# Patient Record
Sex: Male | Born: 1965 | Race: White | Hispanic: No | Marital: Married
Health system: Southern US, Community
[De-identification: ages and names within clinical notes are randomized; demographics above are authoritative.]

## PROBLEM LIST (undated history)

## (undated) DIAGNOSIS — I10 Essential (primary) hypertension: Secondary | ICD-10-CM

## (undated) DIAGNOSIS — F4310 Post-traumatic stress disorder, unspecified: Secondary | ICD-10-CM

## (undated) HISTORY — PX: CHOLECYSTECTOMY: SHX55

---

## 2007-08-30 ENCOUNTER — Encounter: Payer: Self-pay | Admitting: Family Medicine

## 2007-11-01 ENCOUNTER — Encounter: Payer: Self-pay | Admitting: Family Medicine

## 2008-07-07 ENCOUNTER — Ambulatory Visit: Payer: Self-pay | Admitting: Family Medicine

## 2008-07-07 DIAGNOSIS — I10 Essential (primary) hypertension: Secondary | ICD-10-CM | POA: Insufficient documentation

## 2008-07-07 DIAGNOSIS — R740 Nonspecific elevation of levels of transaminase and lactic acid dehydrogenase [LDH]: Secondary | ICD-10-CM

## 2008-07-07 DIAGNOSIS — K219 Gastro-esophageal reflux disease without esophagitis: Secondary | ICD-10-CM | POA: Insufficient documentation

## 2009-01-22 ENCOUNTER — Ambulatory Visit: Payer: Self-pay | Admitting: Family Medicine

## 2009-01-25 LAB — CONVERTED CEMR LAB
ALT: 54 units/L — ABNORMAL HIGH (ref 0–53)
BUN: 12 mg/dL (ref 6–23)
Basophils Relative: 0.2 % (ref 0.0–3.0)
Bilirubin, Direct: 0.1 mg/dL (ref 0.0–0.3)
CO2: 29 meq/L (ref 19–32)
Calcium: 9.1 mg/dL (ref 8.4–10.5)
Chloride: 105 meq/L (ref 96–112)
Cholesterol: 149 mg/dL (ref 0–200)
Creatinine, Ser: 1 mg/dL (ref 0.4–1.5)
Eosinophils Relative: 2.1 % (ref 0.0–5.0)
HDL: 33.8 mg/dL — ABNORMAL LOW (ref >39.00)
Hemoglobin: 15.6 g/dL (ref 13.0–17.0)
LDL Cholesterol: 91 mg/dL (ref 0–99)
Lymphocytes Relative: 31 % (ref 12.0–46.0)
MCHC: 34.6 g/dL (ref 30.0–36.0)
Monocytes Relative: 4.7 % (ref 3.0–12.0)
Neutro Abs: 5.2 10*3/uL (ref 1.4–7.7)
Neutrophils Relative %: 62 % (ref 43.0–77.0)
RBC: 5.2 M/uL (ref 4.22–5.81)
Total Bilirubin: 0.9 mg/dL (ref 0.3–1.2)
Total CHOL/HDL Ratio: 4
Total Protein: 6.9 g/dL (ref 6.0–8.3)
Triglycerides: 122 mg/dL (ref 0.0–149.0)
WBC: 8.4 10*3/uL (ref 4.5–10.5)

## 2010-06-20 ENCOUNTER — Telehealth: Payer: Self-pay | Admitting: Family Medicine

## 2010-10-18 NOTE — Progress Notes (Signed)
  Phone Note Refill Request   Refills Requested: Medication #1:  FELODIPINE 10 MG XR24H-TAB take one tablet daily.   Supply Requested: 1 month  Medication #2:  ATENOLOL 25 MG TABS take one tablet daily   Supply Requested: 1 month [email or web address]   Method Requested: Electronic Initial call taken by: Benny Lennert CMA Duncan Dull),  June 20, 2010 7:43 AM  Follow-up for Phone Call        denied, no OV in 2 years. Follow-up by: Hannah Beat MD,  June 20, 2010 8:10 AM  Additional Follow-up for Phone Call Additional follow up Details #1::        Patient advised.Consuello Masse CMA   Additional Follow-up by: Benny Lennert CMA (AAMA),  June 20, 2010 8:16 AM

## 2010-11-22 ENCOUNTER — Ambulatory Visit: Payer: Self-pay | Admitting: Family Medicine

## 2011-08-24 ENCOUNTER — Ambulatory Visit: Payer: Self-pay | Admitting: Family Medicine

## 2012-02-21 IMAGING — CR DG TIBIA/FIBULA 2V*L*
1 series · 2 of 2 positions shown · non-contrast
Comparison: none

REASON FOR EXAM: closed fracture of unspecified part of fibula with tibia
COMMENTS:

[Series 1: ap · 0.17mm/px · 2 of 2 slices shown]
[im 1/2]
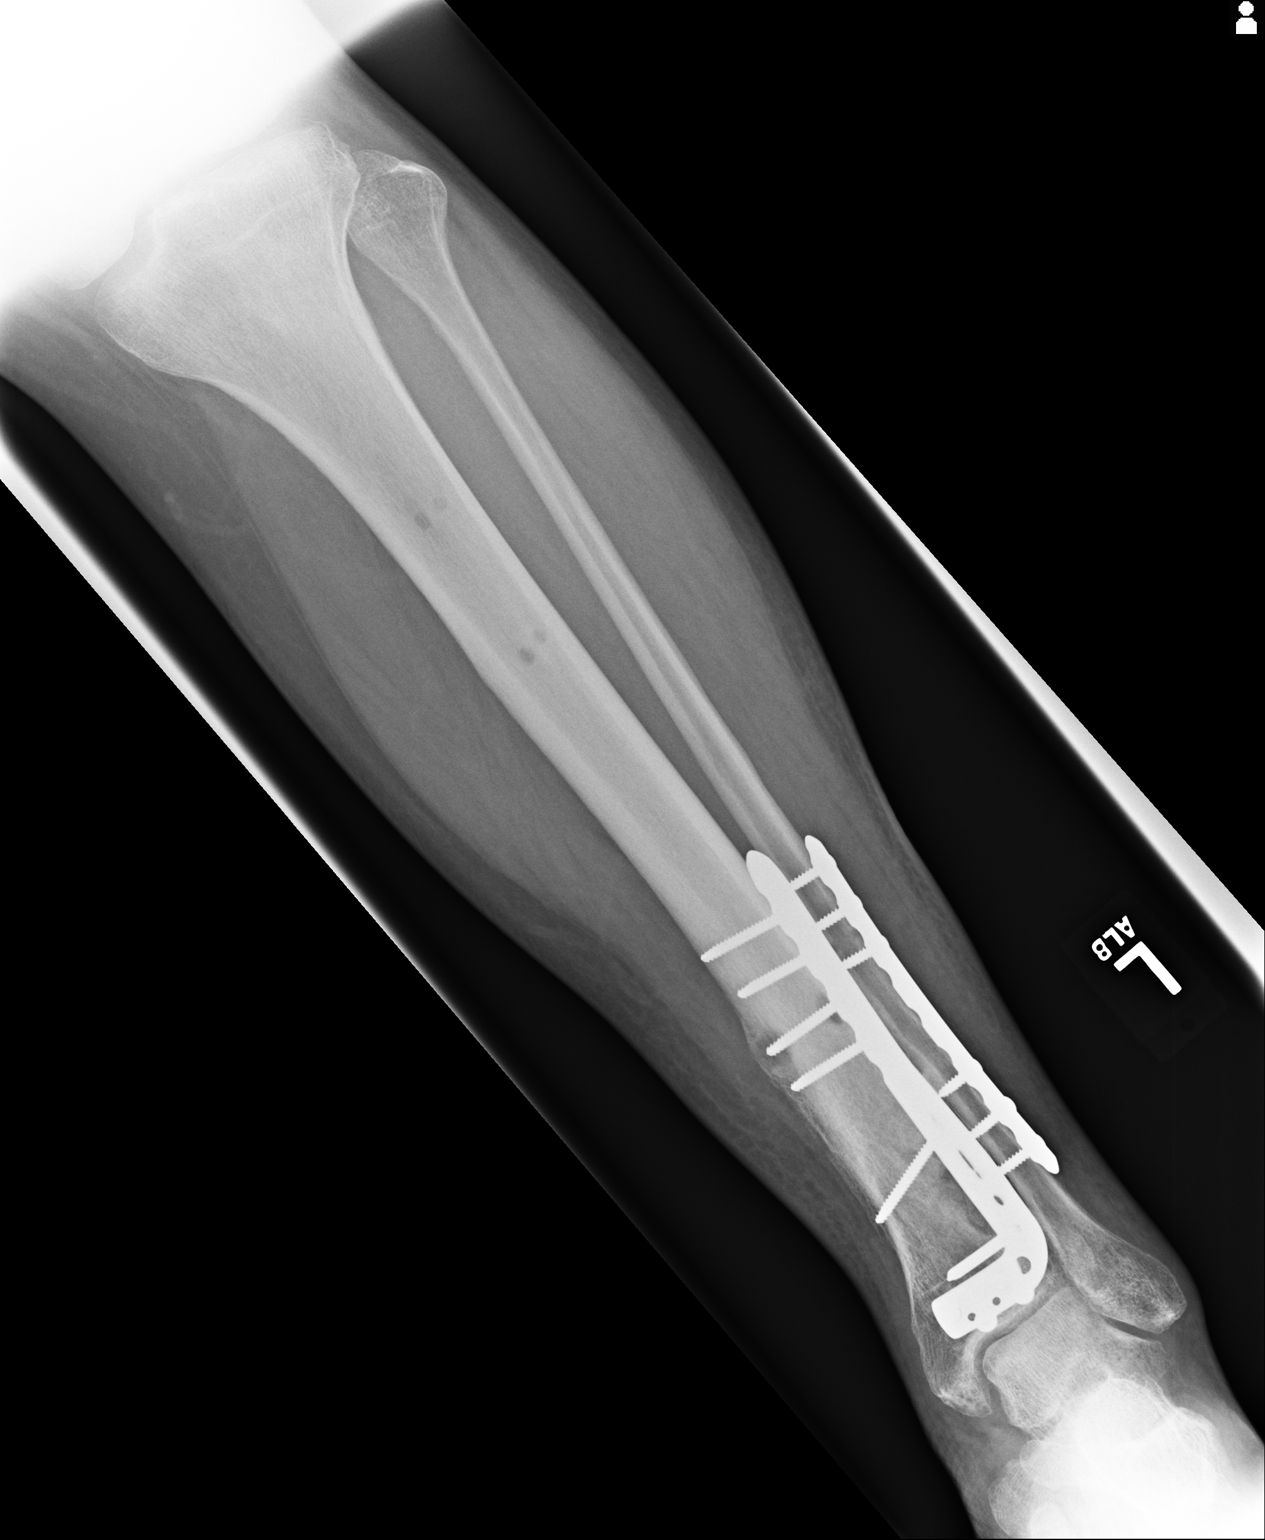
[im 2/2]
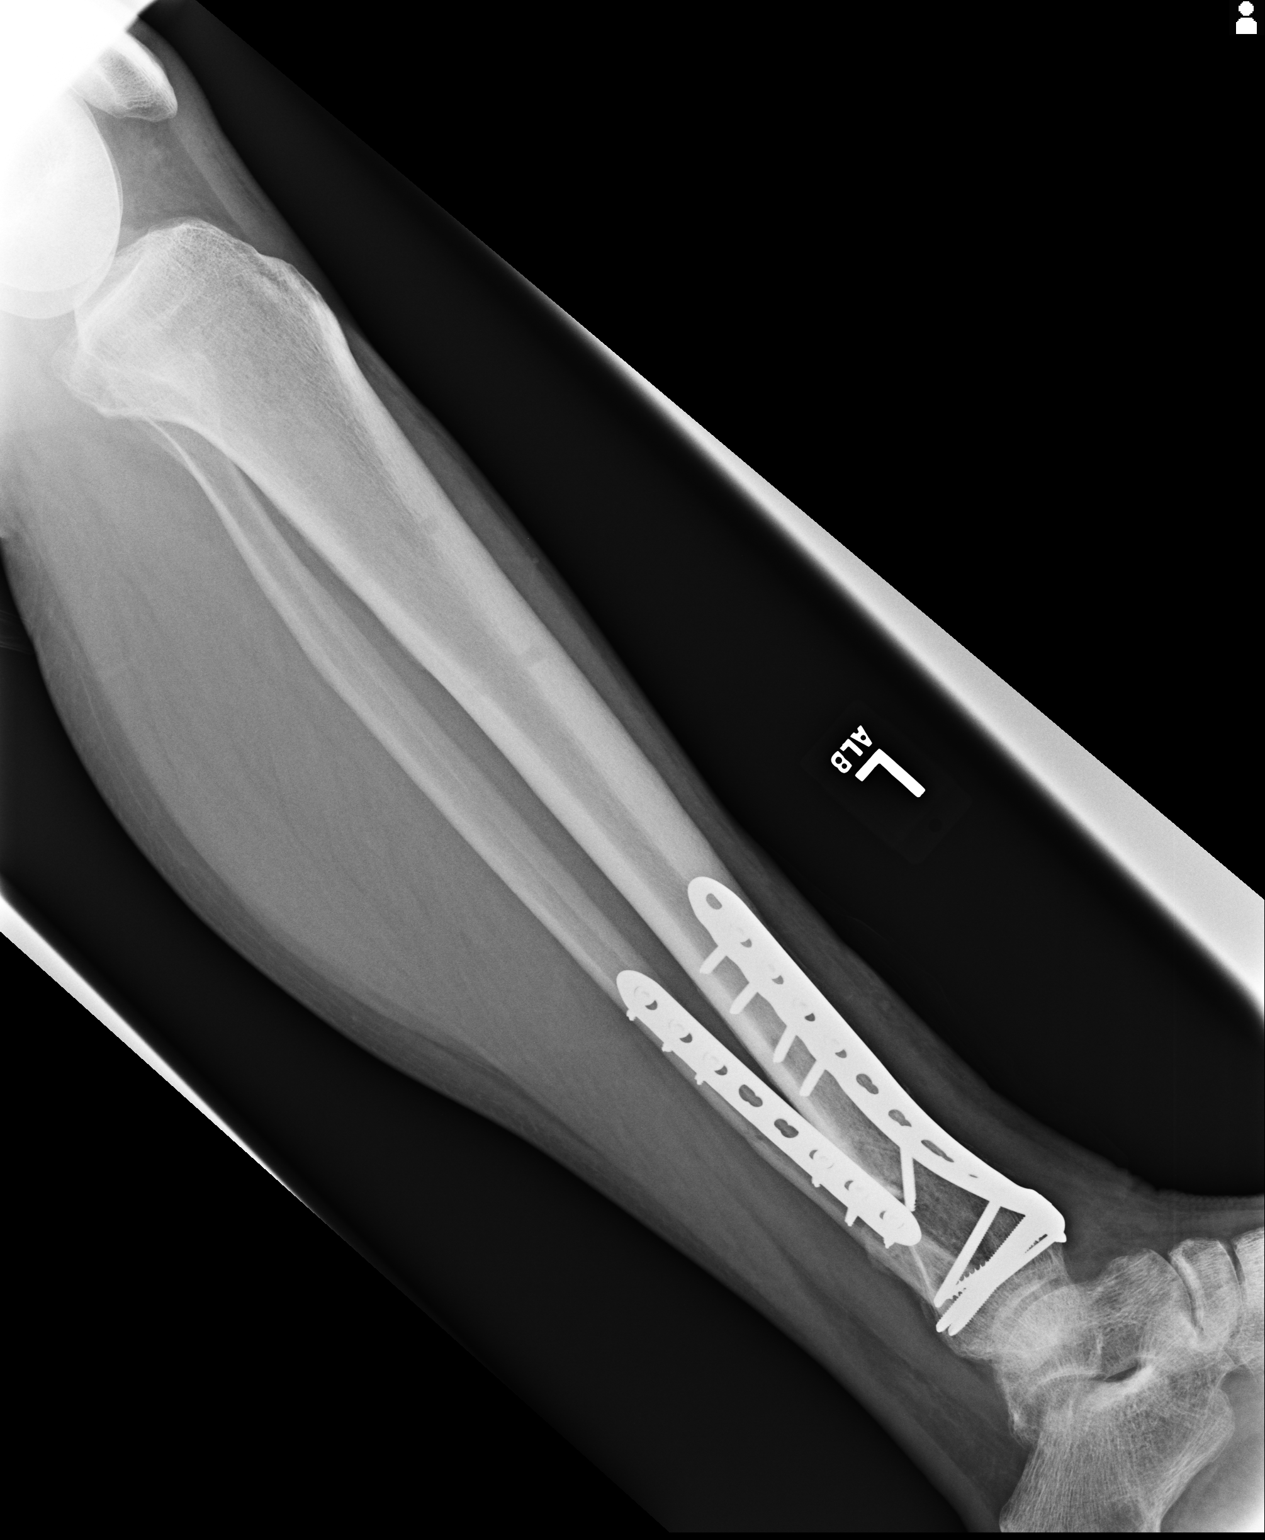

[2 of 2 positions shown; findings below may reference images not displayed]

PROCEDURE:     MDR - MDR TIBIA AND FIBULA LT-LOW LEG  - August 24, 2011  [DATE]

RESULT:     AP and lateral views of the left tibia and fibula are submitted.
There are no previous films for comparison.

Patient has undergone previous ORIF for distal tibial and fibular fractures.
Orthopedic side plates and compression screws are present over the lower one
third of both bones. There are burr holes present from hardware placed more
proximally which is subsequently been removed. The ankle joint mortise is
preserved. I do not see evidence of an acute fracture nor unexpected
periosteal reaction.
IMPRESSION: The patient has undergone previous ORIF of distal fibular
and tibial shaft fractures. I do not see evidence of an acute refracture.
Comparison with the patient's previous studies if any would be useful. I
have no clinical history. If there are clinical concerns of osteomyelitis, a
3 phase nuclear bone scan may be a useful next step.

## 2015-12-10 ENCOUNTER — Ambulatory Visit
Admission: EM | Admit: 2015-12-10 | Discharge: 2015-12-10 | Disposition: A | Discharge status: Home / Self Care | Payer: 59

## 2018-08-06 ENCOUNTER — Encounter: Payer: Self-pay | Admitting: Emergency Medicine

## 2018-08-06 ENCOUNTER — Other Ambulatory Visit: Payer: Self-pay

## 2018-08-06 ENCOUNTER — Emergency Department: Payer: No Typology Code available for payment source

## 2018-08-06 ENCOUNTER — Emergency Department
Admission: EM | Admit: 2018-08-06 | Discharge: 2018-08-06 | Disposition: A | Discharge status: Home / Self Care | Payer: No Typology Code available for payment source | Attending: Emergency Medicine | Admitting: Emergency Medicine

## 2018-08-06 DIAGNOSIS — R079 Chest pain, unspecified: Secondary | ICD-10-CM | POA: Insufficient documentation

## 2018-08-06 DIAGNOSIS — J209 Acute bronchitis, unspecified: Secondary | ICD-10-CM | POA: Diagnosis not present

## 2018-08-06 DIAGNOSIS — I10 Essential (primary) hypertension: Secondary | ICD-10-CM | POA: Diagnosis not present

## 2018-08-06 DIAGNOSIS — J40 Bronchitis, not specified as acute or chronic: Secondary | ICD-10-CM

## 2018-08-06 HISTORY — DX: Post-traumatic stress disorder, unspecified: F43.10

## 2018-08-06 HISTORY — DX: Essential (primary) hypertension: I10

## 2018-08-06 LAB — CBC
HCT: 49.1 % (ref 39.0–52.0)
Hemoglobin: 16.6 g/dL (ref 13.0–17.0)
MCH: 30.2 pg (ref 26.0–34.0)
MCHC: 33.8 g/dL (ref 30.0–36.0)
MCV: 89.3 fL (ref 80.0–100.0)
PLATELETS: 177 10*3/uL (ref 150–400)
RBC: 5.5 MIL/uL (ref 4.22–5.81)
RDW: 12.7 % (ref 11.5–15.5)
WBC: 11.4 10*3/uL — ABNORMAL HIGH (ref 4.0–10.5)
nRBC: 0 % (ref 0.0–0.2)

## 2018-08-06 LAB — TROPONIN I: Troponin I: <0.03 ng/mL (ref <0.03)

## 2018-08-06 LAB — BASIC METABOLIC PANEL
ANION GAP: 11 (ref 5–15)
BUN: 11 mg/dL (ref 6–20)
CALCIUM: 9.8 mg/dL (ref 8.9–10.3)
CO2: 27 mmol/L (ref 22–32)
CREATININE: 0.99 mg/dL (ref 0.61–1.24)
Chloride: 101 mmol/L (ref 98–111)
GFR calc Af Amer: >60 mL/min (ref >60)
GLUCOSE: 117 mg/dL — AB (ref 70–99)
Potassium: 4.2 mmol/L (ref 3.5–5.1)
Sodium: 139 mmol/L (ref 135–145)

## 2018-08-06 MED ORDER — AZITHROMYCIN 250 MG PO TABS: ORAL_TABLET | ORAL | 0 refills | Status: AC

## 2018-08-06 NOTE — ED Provider Notes (Signed)
[facility] Emergency Department Provider Note  Time seen: 3:27 PM  I have reviewed the triage vital signs and the nursing notes.   HISTORY  Chief Complaint Chest Pain    HPI LEORY ALLINSON is a 52 y.o. male with a past medical history of hypertension, PTSD, presents to the emergency department for chest pain.  According to the patient he developed chest discomfort last night, took Tums and an Ativan, states his symptoms resolved.  Again this morning around 7 AM he once again had similar discomfort to his chest, states it feels almost like heartburn.  Once again took Tums this morning and states symptoms did resolve with an approximate 30 minutes.  Denies any shortness of breath nausea or diaphoresis.  No history of heart disease per patient.     Past Medical History:  Diagnosis Date  . Hypertension   . PTSD (post-traumatic stress disorder)     Patient Active Problem List   Diagnosis Date Noted  . HYPERTENSION 07/07/2008  . GERD 07/07/2008  . TRANSAMINASES, SERUM, ELEVATED 07/07/2008    Past Surgical History:  Procedure Laterality Date  . CHOLECYSTECTOMY      Prior to Admission medications   Not on File    Not on File  No family history on file.  Social History Social History   Tobacco Use  . Smoking status: Not on file  Substance Use Topics  . Alcohol use: Not on file  . Drug use: Not on file    Review of Systems Constitutional: Negative for fever. Cardiovascular: Intermittent chest pain since last night Respiratory: Negative for shortness of breath.  Continues to have cough x3 weeks. Gastrointestinal: Negative for abdominal pain, vomiting  Genitourinary: Negative for urinary compaints Musculoskeletal: Negative for leg pain or swelling Skin: Negative for skin complaints  Neurological: Negative for headache All other ROS negative  ____________________________________________   PHYSICAL EXAM:  VITAL SIGNS: ED Triage Vitals   Enc Vitals Group     BP 08/06/18 1126 (!) 157/105     Pulse Rate 08/06/18 1126 92     Resp 08/06/18 1126 18     Temp 08/06/18 1126 98.4 F (36.9 C)     Temp Source 08/06/18 1126 Oral     SpO2 08/06/18 1126 100 %     Weight 08/06/18 1127 220 lb (99.8 kg)     Height 08/06/18 1127 5\' 10"  (1.778 m)     Head Circumference --      Peak Flow --      Pain Score 08/06/18 1127 4     Pain Loc --      Pain Edu? --      Excl. in GC? --    Constitutional: Alert and oriented. Well appearing and in no distress. Eyes: Normal exam ENT   Head: Normocephalic and atraumatic.   Mouth/Throat: Mucous membranes are moist. Cardiovascular: Normal rate, regular rhythm. No murmur Respiratory: Normal respiratory effort without tachypnea nor retractions. Breath sounds are clear.  Chest wall is nontender to palpation. Gastrointestinal: Soft and nontender. No distention.  Musculoskeletal: Nontender with normal range of motion in all extremities.  Neurologic:  Normal speech and language. No gross focal neurologic deficits  Skin:  Skin is warm, dry and intact.  Psychiatric: Mood and affect are normal. Speech and behavior are normal.   ____________________________________________    EKG  EKG reviewed and interpreted by myself shows a normal sinus rhythm 84 bpm with a narrow QRS, normal axis, normal intervals, no concerning  ST changes, early repolarization.  ____________________________________________    RADIOLOGY  Chest x-ray shows probable bronchitis.  ____________________________________________   INITIAL IMPRESSION / ASSESSMENT AND PLAN / ED COURSE  Pertinent labs & imaging results that were available during my care of the patient were reviewed by me and considered in my medical decision making (see chart for details).  Patient presents emergency department for chest pain, last night and again this morning.  States both episodes resolved after taking Tums/Xanax.  Patient denies any  symptoms currently.  Denies any nausea or diaphoresis or shortness of breath at any point.  Patient states he has been coughing for approximately 3 weeks, initially with congestion and he believes low-grade fever although those have since resolved.  Continues to have a cough.  Chest x-ray is read as likely acute bronchitis, labs otherwise largely within normal limits initial troponin is negative.  Given the possibility of acute bronchitis causing his symptoms we will cover with Zithromax as a precaution.  We will repeat a troponin.  If the patient's repeat troponin remains negative patient will be discharged home with PCP follow-up.  I also discussed a stress test, we will refer to cardiology, patient is agreeable to plan of care.    Repeat troponin remains negative.  We will discharge with Zithromax PCP/cardiology follow-up.  Patient agreeable to plan of care.  Provided my normal chest pain return precautions.  ____________________________________________   FINAL CLINICAL IMPRESSION(S) / ED DIAGNOSES  Chest pain    Minna AntisPaduchowski, Arlo Buffone, MD 08/06/18 1611

## 2018-08-06 NOTE — ED Triage Notes (Signed)
Pt arrived with complaints of two episodes of chest pain. Pt states the first episode started last night and lasted 1 hour and the second episode happened this morning and remained constant for 45 minutes. Pt states the pain is in his left chest and radiates to his neck. Pt in NAD in triage

## 2019-02-03 IMAGING — CR DG CHEST 2V
2 series · 2 of 2 positions shown · non-contrast
Comparison: None.

CLINICAL DATA: Chest pain and tightness since last night associated
with shortness of breath. History of hypertension.

EXAM:
CHEST - 2 VIEW

[chest pa]
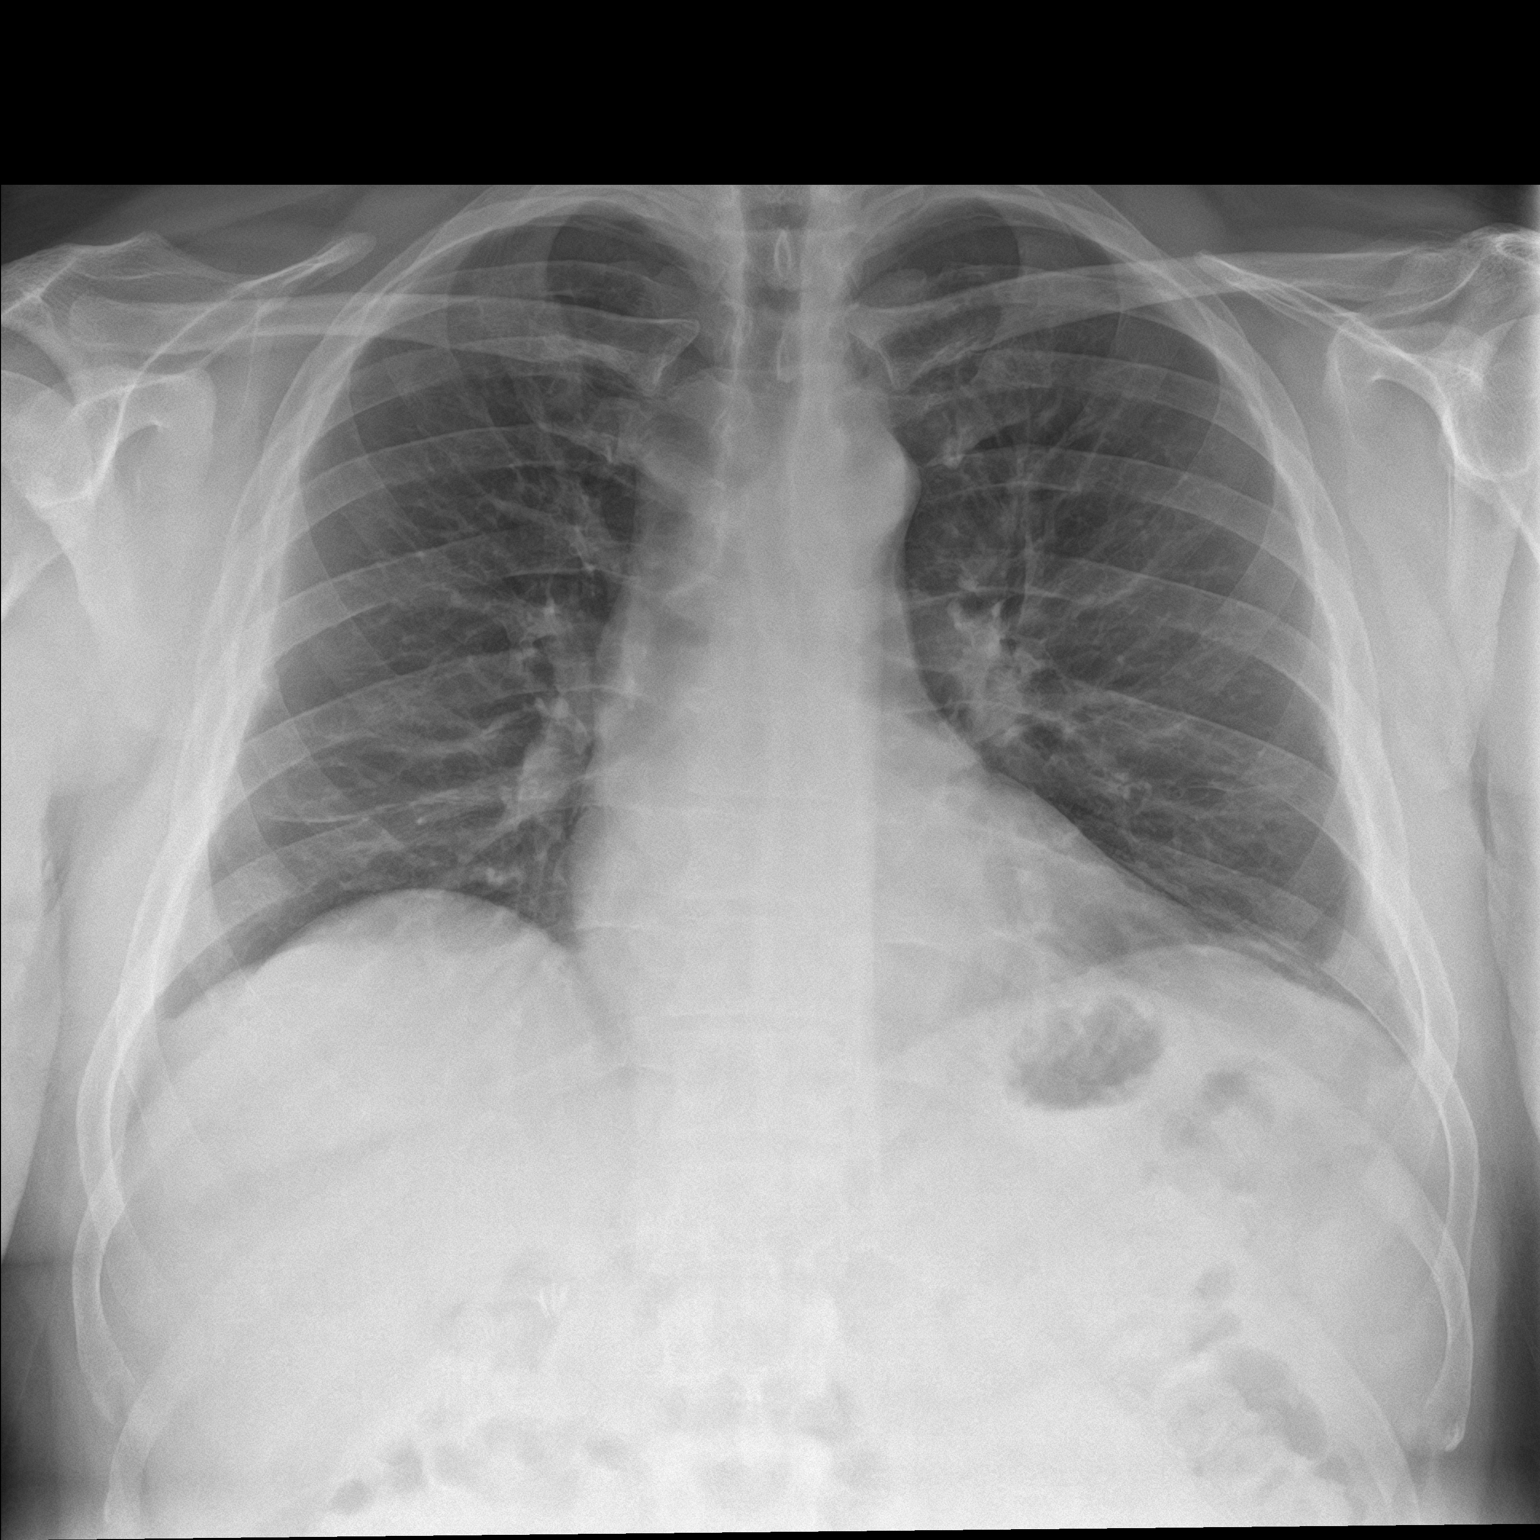

[chest lat]
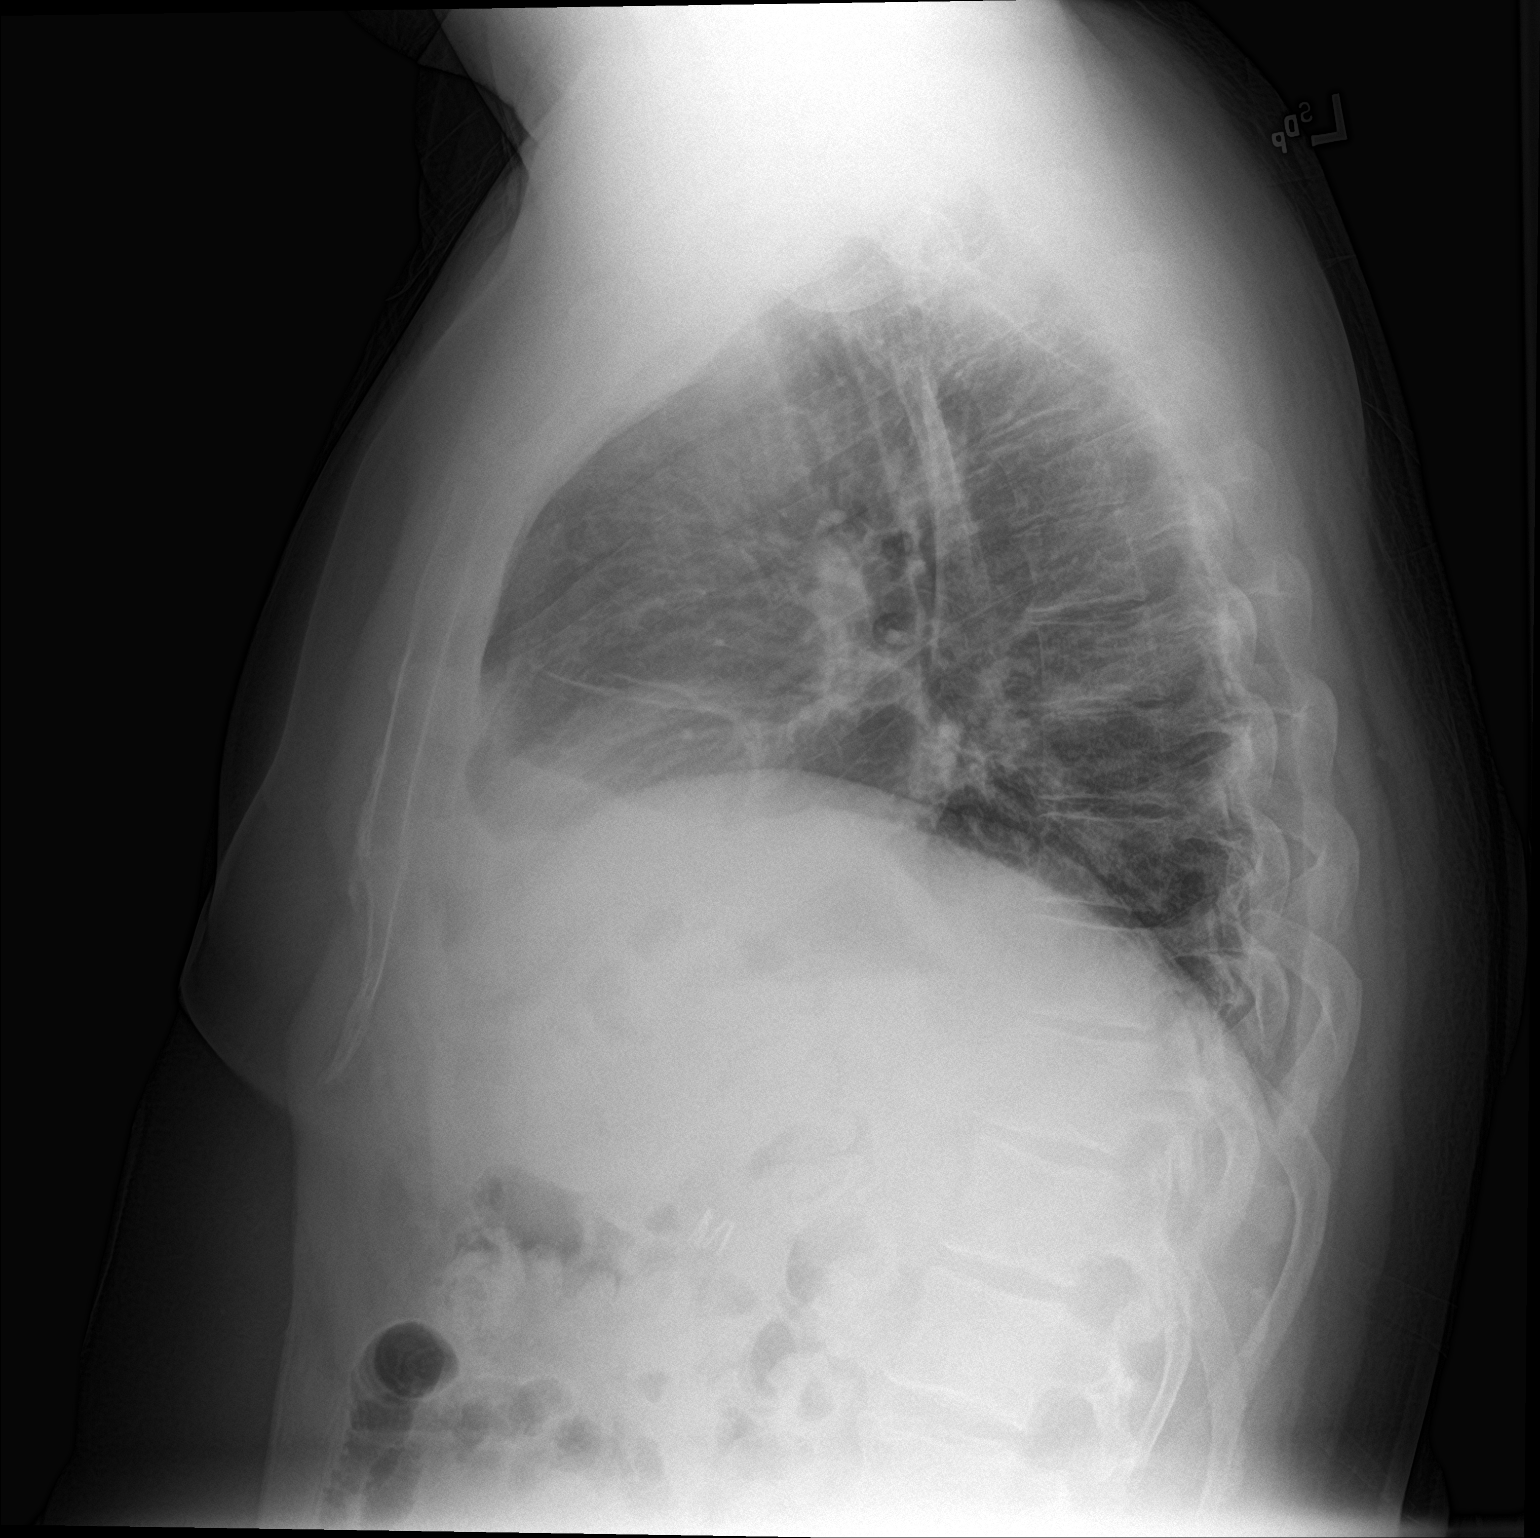

[2 of 2 positions shown; findings below may reference images not displayed]

FINDINGS: The lungs are adequately inflated on the frontal view but
hypoinflated on the lateral view. The interstitial markings are
coarse especially at the lung bases. There is no alveolar infiltrate
or pleural effusion. The heart and pulmonary vascularity are normal.
The trachea is midline. The bony thorax exhibits no acute
abnormality.
IMPRESSION: Probable acute bronchitis with bibasilar atelectasis. No alveolar
pneumonia nor CHF.
